# Patient Record
Sex: Female | Born: 1960 | Race: Black or African American | Hispanic: No | Marital: Single | Smoking: Never smoker
Health system: Southern US, Community
[De-identification: ages and names within clinical notes are randomized; demographics above are authoritative.]

## PROBLEM LIST (undated history)

## (undated) DIAGNOSIS — I10 Essential (primary) hypertension: Secondary | ICD-10-CM

## (undated) DIAGNOSIS — M519 Unspecified thoracic, thoracolumbar and lumbosacral intervertebral disc disorder: Secondary | ICD-10-CM

---

## 1999-04-30 ENCOUNTER — Encounter: Payer: Self-pay | Admitting: Emergency Medicine

## 1999-04-30 ENCOUNTER — Emergency Department (HOSPITAL_COMMUNITY): Admission: EM | Admit: 1999-04-30 | Discharge: 1999-04-30 | Payer: Self-pay | Admitting: Emergency Medicine

## 2004-03-27 ENCOUNTER — Emergency Department (HOSPITAL_COMMUNITY): Admission: EM | Admit: 2004-03-27 | Discharge: 2004-03-27 | Payer: Self-pay | Admitting: Emergency Medicine

## 2004-03-31 ENCOUNTER — Other Ambulatory Visit: Admission: RE | Admit: 2004-03-31 | Discharge: 2004-03-31 | Payer: Self-pay | Admitting: Obstetrics and Gynecology

## 2004-04-03 ENCOUNTER — Observation Stay (HOSPITAL_COMMUNITY): Admission: AD | Admit: 2004-04-03 | Discharge: 2004-04-03 | Payer: Self-pay | Admitting: Obstetrics and Gynecology

## 2004-04-10 ENCOUNTER — Ambulatory Visit (HOSPITAL_COMMUNITY): Admission: RE | Admit: 2004-04-10 | Discharge: 2004-04-10 | Payer: Self-pay | Admitting: Obstetrics and Gynecology

## 2004-04-11 ENCOUNTER — Emergency Department (HOSPITAL_COMMUNITY): Admission: EM | Admit: 2004-04-11 | Discharge: 2004-04-11 | Payer: Self-pay | Admitting: Emergency Medicine

## 2005-03-17 ENCOUNTER — Emergency Department (HOSPITAL_COMMUNITY): Admission: EM | Admit: 2005-03-17 | Discharge: 2005-03-17 | Payer: Self-pay | Admitting: Family Medicine

## 2005-03-30 ENCOUNTER — Emergency Department (HOSPITAL_COMMUNITY): Admission: EM | Admit: 2005-03-30 | Discharge: 2005-03-30 | Payer: Self-pay | Admitting: Family Medicine

## 2005-12-09 ENCOUNTER — Inpatient Hospital Stay (HOSPITAL_COMMUNITY): Admission: AD | Admit: 2005-12-09 | Discharge: 2005-12-09 | Payer: Self-pay | Admitting: Gynecology

## 2006-06-10 ENCOUNTER — Emergency Department (HOSPITAL_COMMUNITY): Admission: EM | Admit: 2006-06-10 | Discharge: 2006-06-10 | Payer: Self-pay | Admitting: Emergency Medicine

## 2006-11-08 ENCOUNTER — Encounter: Admission: RE | Admit: 2006-11-08 | Discharge: 2006-11-08 | Payer: Self-pay | Admitting: Occupational Medicine

## 2006-12-09 ENCOUNTER — Encounter: Admission: RE | Admit: 2006-12-09 | Discharge: 2006-12-18 | Payer: Self-pay | Admitting: Occupational Medicine

## 2007-01-27 ENCOUNTER — Emergency Department (HOSPITAL_COMMUNITY): Admission: EM | Admit: 2007-01-27 | Discharge: 2007-01-27 | Payer: Self-pay | Admitting: Emergency Medicine

## 2007-02-18 ENCOUNTER — Ambulatory Visit (HOSPITAL_COMMUNITY): Admission: RE | Admit: 2007-02-18 | Discharge: 2007-02-18 | Payer: Self-pay | Admitting: Cardiology

## 2008-07-24 ENCOUNTER — Emergency Department (HOSPITAL_COMMUNITY): Admission: EM | Admit: 2008-07-24 | Discharge: 2008-07-24 | Payer: Self-pay | Admitting: Emergency Medicine

## 2009-06-28 ENCOUNTER — Encounter: Admission: RE | Admit: 2009-06-28 | Discharge: 2009-06-28 | Payer: Self-pay | Admitting: Neurology

## 2009-07-14 ENCOUNTER — Encounter: Admission: RE | Admit: 2009-07-14 | Discharge: 2009-07-14 | Payer: Self-pay | Admitting: Neurology

## 2010-01-26 ENCOUNTER — Encounter: Payer: Self-pay | Admitting: Obstetrics and Gynecology

## 2010-09-25 LAB — I-STAT 8, (EC8 V) (CONVERTED LAB)
BUN: 10
Chloride: 108
Glucose, Bld: 86
Hemoglobin: 13.6
Potassium: 3.7
Sodium: 138
TCO2: 26
pCO2, Ven: 39.2 — ABNORMAL LOW

## 2010-09-25 LAB — CBC
MCHC: 32.4
RBC: 4.46
WBC: 6.6

## 2010-09-25 LAB — DIFFERENTIAL
Basophils Relative: 0
Lymphocytes Relative: 32
Monocytes Absolute: 0.5
Neutrophils Relative %: 59

## 2010-09-25 LAB — POCT CARDIAC MARKERS: Operator id: 198171

## 2010-09-25 LAB — D-DIMER, QUANTITATIVE: D-Dimer, Quant: <0.22

## 2010-09-25 LAB — POCT I-STAT CREATININE: Creatinine, Ser: 0.8

## 2010-09-26 LAB — COMPREHENSIVE METABOLIC PANEL
AST: 28
Albumin: 4.2
Calcium: 9.3
Chloride: 106
GFR calc Af Amer: >60
GFR calc non Af Amer: >60
Sodium: 139

## 2010-09-26 LAB — URINALYSIS, MICROSCOPIC ONLY
Glucose, UA: NEGATIVE
Ketones, ur: NEGATIVE
Protein, ur: NEGATIVE
Specific Gravity, Urine: 1.019

## 2010-09-26 LAB — CBC
HCT: 36.4
MCHC: 33
MCV: 82.6
Platelets: 309
RBC: 4.41
RDW: 17.9 — ABNORMAL HIGH

## 2010-09-26 LAB — ANA: Anti Nuclear Antibody(ANA): NEGATIVE

## 2012-09-08 ENCOUNTER — Emergency Department (HOSPITAL_BASED_OUTPATIENT_CLINIC_OR_DEPARTMENT_OTHER)
Admission: EM | Admit: 2012-09-08 | Discharge: 2012-09-08 | Disposition: A | Discharge status: Home / Self Care | Payer: Worker's Compensation | Attending: Emergency Medicine | Admitting: Emergency Medicine

## 2012-09-08 ENCOUNTER — Encounter (HOSPITAL_BASED_OUTPATIENT_CLINIC_OR_DEPARTMENT_OTHER): Payer: Self-pay | Admitting: *Deleted

## 2012-09-08 DIAGNOSIS — Z79899 Other long term (current) drug therapy: Secondary | ICD-10-CM | POA: Insufficient documentation

## 2012-09-08 DIAGNOSIS — Z8739 Personal history of other diseases of the musculoskeletal system and connective tissue: Secondary | ICD-10-CM | POA: Insufficient documentation

## 2012-09-08 DIAGNOSIS — G8929 Other chronic pain: Secondary | ICD-10-CM | POA: Insufficient documentation

## 2012-09-08 DIAGNOSIS — Y929 Unspecified place or not applicable: Secondary | ICD-10-CM | POA: Insufficient documentation

## 2012-09-08 DIAGNOSIS — Y9301 Activity, walking, marching and hiking: Secondary | ICD-10-CM | POA: Insufficient documentation

## 2012-09-08 DIAGNOSIS — Y990 Civilian activity done for income or pay: Secondary | ICD-10-CM | POA: Insufficient documentation

## 2012-09-08 DIAGNOSIS — Z791 Long term (current) use of non-steroidal anti-inflammatories (NSAID): Secondary | ICD-10-CM | POA: Insufficient documentation

## 2012-09-08 DIAGNOSIS — I10 Essential (primary) hypertension: Secondary | ICD-10-CM | POA: Insufficient documentation

## 2012-09-08 DIAGNOSIS — M549 Dorsalgia, unspecified: Secondary | ICD-10-CM

## 2012-09-08 DIAGNOSIS — X500XXA Overexertion from strenuous movement or load, initial encounter: Secondary | ICD-10-CM | POA: Insufficient documentation

## 2012-09-08 DIAGNOSIS — IMO0002 Reserved for concepts with insufficient information to code with codable children: Secondary | ICD-10-CM | POA: Insufficient documentation

## 2012-09-08 HISTORY — DX: Unspecified thoracic, thoracolumbar and lumbosacral intervertebral disc disorder: M51.9

## 2012-09-08 HISTORY — DX: Essential (primary) hypertension: I10

## 2012-09-08 MED ORDER — ONDANSETRON HCL 4 MG/2ML IJ SOLN
4.0000 mg | Freq: Once | INTRAMUSCULAR | Status: AC
  Administered 2012-09-08: 4 mg via INTRAMUSCULAR
  Filled 2012-09-08: qty 2

## 2012-09-08 MED ORDER — HYDROCODONE-ACETAMINOPHEN 5-325 MG PO TABS: 2.0000 | ORAL_TABLET | ORAL | Status: DC | PRN

## 2012-09-08 MED ORDER — DEXAMETHASONE SODIUM PHOSPHATE 10 MG/ML IJ SOLN
10.0000 mg | Freq: Once | INTRAMUSCULAR | Status: AC
  Administered 2012-09-08: 10 mg via INTRAMUSCULAR
  Filled 2012-09-08: qty 1

## 2012-09-08 MED ORDER — HYDROMORPHONE HCL PF 2 MG/ML IJ SOLN
2.0000 mg | Freq: Once | INTRAMUSCULAR | Status: AC
  Administered 2012-09-08: 2 mg via INTRAMUSCULAR
  Filled 2012-09-08: qty 1

## 2012-09-08 NOTE — ED Notes (Signed)
Pt to room 2 by ptar, reporting she was walking through a door at work and another employee was coming the opposite direction, she braced herself and "my back just locked up". Pt states she has a hx of lbp and disc problems.

## 2012-09-08 NOTE — ED Provider Notes (Signed)
CSN: [id number]     Arrival date & time 09/08/12  1849 History   First MD Initiated Contact with Patient 09/08/12 1903     Chief Complaint  Patient presents with  . Back Pain   (Consider location/radiation/quality/duration/timing/severity/associated sxs/prior Treatment) HPI Comments: Patient with a history of chronic back pain secondary to "disc problems", presents with worsening back pain. She states she was walking through a door at work and was trying to miss another employee and suddenly twisted her back. She states that it locked up and she's having bad muscle spasms on the left side of her back. The pain radiates slightly into her leg. She did not fall. She denies any numbness or weakness to her legs. She denies any perineal anesthesia or bowel or bladder incontinence. She states this is similar pain that she's had before when her back "locks up". She currently does not have a provider to manage her ongoing back problems.  Patient is a 52 y.o. female presenting with back pain.  Back Pain Associated symptoms: no abdominal pain, no chest pain, no fever, no headaches, no numbness and no weakness     Past Medical History  Diagnosis Date  . Hypertension   . Ruptured disk    History reviewed. No pertinent past surgical history. No family history on file. History  Substance Use Topics  . Smoking status: Never Smoker   . Smokeless tobacco: Not on file  . Alcohol Use: Not on file   OB History   Grav Para Term Preterm Abortions TAB SAB Ect Mult Living                 Review of Systems  Constitutional: Negative for fever, chills, diaphoresis and fatigue.  HENT: Negative for congestion, rhinorrhea and sneezing.   Eyes: Negative.   Respiratory: Negative for cough, chest tightness and shortness of breath.   Cardiovascular: Negative for chest pain and leg swelling.  Gastrointestinal: Negative for nausea, vomiting, abdominal pain, diarrhea and blood in stool.  Genitourinary: Negative for  frequency, hematuria, flank pain and difficulty urinating.  Musculoskeletal: Positive for back pain. Negative for arthralgias.  Skin: Negative for rash.  Neurological: Negative for dizziness, speech difficulty, weakness, numbness and headaches.    Allergies  Review of patient's allergies indicates no known allergies.  Home Medications   Current Outpatient Rx  Name  Route  Sig  Dispense  Refill  . cyclobenzaprine (FLEXERIL) 10 MG tablet   Oral   Take 10 mg by mouth 2 (two) times daily as needed for muscle spasms.         Marland Kitchen LISINOPRIL PO   Oral   Take by mouth.         . meloxicam (MOBIC) 15 MG tablet   Oral   Take 15 mg by mouth daily.         Marland Kitchen HYDROcodone-acetaminophen (NORCO/VICODIN) 5-325 MG per tablet   Oral   Take 2 tablets by mouth every 4 (four) hours as needed for pain.   15 tablet   0    BP 145/87  Pulse 87  Temp(Src) 98.2 F (36.8 C) (Oral)  Resp 18  Ht 5\' 4"  (1.626 m)  Wt 140 lb (63.504 kg)  BMI 24.02 kg/m2  SpO2 100% Physical Exam  Constitutional: She is oriented to person, place, and time. She appears well-developed and well-nourished.  HENT:  Head: Normocephalic and atraumatic.  Eyes: Pupils are equal, round, and reactive to light.  Neck: Normal range of motion. Neck supple.  Cardiovascular: Normal rate, regular rhythm and normal heart sounds.   Pulmonary/Chest: Effort normal and breath sounds normal. No respiratory distress. She has no wheezes. She has no rales. She exhibits no tenderness.  Abdominal: Soft. Bowel sounds are normal. There is no tenderness. There is no rebound and no guarding.  Musculoskeletal: Normal range of motion. She exhibits no edema.  Positive tenderness throughout the lower lumbar spine, primarily in the left side. There is new step-offs or deformities lumbar spine. Positive straight leg raise on the left. She has normal sensation and motor function in the legs. Pedal pulses are intact.  Lymphadenopathy:    She has no  cervical adenopathy.  Neurological: She is alert and oriented to person, place, and time.  Skin: Skin is warm and dry. No rash noted.  Psychiatric: She has a normal mood and affect.    ED Course  Procedures (including critical care time) Labs Review Labs Reviewed - No data to display Imaging Review No results found.  MDM   1. Back pain    Patient is feeling much better after the medications administered in the ED. She has no neurologic deficits. She was given a prescription for Percocet to use at home and referred to the [facility] for followup.    Rolan Bucco, MD 09/08/12 2053

## 2012-09-21 NOTE — Progress Notes (Signed)
ED CM received call from [facility] at [facility] at [place]. 336 [phone], querying filling a prescription for Norco 5/325 that was written on 09/08/12.  Spoke with Dr. Fredderick Phenix she approve the prescription to be filled.

## 2013-03-15 ENCOUNTER — Encounter (HOSPITAL_BASED_OUTPATIENT_CLINIC_OR_DEPARTMENT_OTHER): Payer: Self-pay | Admitting: Emergency Medicine

## 2013-03-15 ENCOUNTER — Emergency Department (HOSPITAL_BASED_OUTPATIENT_CLINIC_OR_DEPARTMENT_OTHER): Payer: Medicaid Other

## 2013-03-15 ENCOUNTER — Emergency Department (HOSPITAL_BASED_OUTPATIENT_CLINIC_OR_DEPARTMENT_OTHER)
Admission: EM | Admit: 2013-03-15 | Discharge: 2013-03-15 | Disposition: A | Discharge status: Home / Self Care | Payer: Medicaid Other | Attending: Emergency Medicine | Admitting: Emergency Medicine

## 2013-03-15 DIAGNOSIS — R519 Headache, unspecified: Secondary | ICD-10-CM

## 2013-03-15 DIAGNOSIS — R51 Headache: Secondary | ICD-10-CM | POA: Insufficient documentation

## 2013-03-15 DIAGNOSIS — I10 Essential (primary) hypertension: Secondary | ICD-10-CM | POA: Insufficient documentation

## 2013-03-15 DIAGNOSIS — R209 Unspecified disturbances of skin sensation: Secondary | ICD-10-CM | POA: Insufficient documentation

## 2013-03-15 DIAGNOSIS — R112 Nausea with vomiting, unspecified: Secondary | ICD-10-CM | POA: Insufficient documentation

## 2013-03-15 DIAGNOSIS — Z791 Long term (current) use of non-steroidal anti-inflammatories (NSAID): Secondary | ICD-10-CM | POA: Insufficient documentation

## 2013-03-15 DIAGNOSIS — R42 Dizziness and giddiness: Secondary | ICD-10-CM

## 2013-03-15 DIAGNOSIS — Z8739 Personal history of other diseases of the musculoskeletal system and connective tissue: Secondary | ICD-10-CM | POA: Insufficient documentation

## 2013-03-15 LAB — CBC WITH DIFFERENTIAL/PLATELET
BASOS PCT: 0 % (ref 0–1)
Basophils Absolute: 0 10*3/uL (ref 0.0–0.1)
EOS PCT: 1 % (ref 0–5)
Eosinophils Absolute: 0.1 10*3/uL (ref 0.0–0.7)
HEMATOCRIT: 38.8 % (ref 36.0–46.0)
Hemoglobin: 13 g/dL (ref 12.0–15.0)
LYMPHS PCT: 32 % (ref 12–46)
Lymphs Abs: 2.3 10*3/uL (ref 0.7–4.0)
MCH: 29.3 pg (ref 26.0–34.0)
MCHC: 33.5 g/dL (ref 30.0–36.0)
MCV: 87.6 fL (ref 78.0–100.0)
Monocytes Absolute: 0.6 10*3/uL (ref 0.1–1.0)
Monocytes Relative: 8 % (ref 3–12)
Neutro Abs: 4.2 10*3/uL (ref 1.7–7.7)
Neutrophils Relative %: 58 % (ref 43–77)
PLATELETS: 281 10*3/uL (ref 150–400)
RBC: 4.43 MIL/uL (ref 3.87–5.11)
RDW: 13 % (ref 11.5–15.5)
WBC: 7.2 10*3/uL (ref 4.0–10.5)

## 2013-03-15 LAB — I-STAT CHEM 8, ED
BUN: 12 mg/dL (ref 6–23)
CALCIUM ION: 1 mmol/L — AB (ref 1.12–1.23)
CHLORIDE: 106 meq/L (ref 96–112)
Creatinine, Ser: 0.9 mg/dL (ref 0.50–1.10)
GLUCOSE: 91 mg/dL (ref 70–99)
HEMATOCRIT: 40 % (ref 36.0–46.0)
Hemoglobin: 13.6 g/dL (ref 12.0–15.0)
Potassium: 3.3 mEq/L — ABNORMAL LOW (ref 3.7–5.3)
Sodium: 139 mEq/L (ref 137–147)
TCO2: 18 mmol/L (ref 0–100)

## 2013-03-15 LAB — TROPONIN I

## 2013-03-15 MED ORDER — METOCLOPRAMIDE HCL 5 MG/ML IJ SOLN
10.0000 mg | Freq: Once | INTRAMUSCULAR | Status: AC
  Administered 2013-03-15: 10 mg via INTRAVENOUS
  Filled 2013-03-15: qty 2

## 2013-03-15 MED ORDER — MECLIZINE HCL 25 MG PO TABS
25.0000 mg | ORAL_TABLET | Freq: Once | ORAL | Status: AC
  Administered 2013-03-15: 25 mg via ORAL
  Filled 2013-03-15: qty 1

## 2013-03-15 MED ORDER — KETOROLAC TROMETHAMINE 30 MG/ML IJ SOLN
30.0000 mg | Freq: Once | INTRAMUSCULAR | Status: AC
  Administered 2013-03-15: 30 mg via INTRAVENOUS
  Filled 2013-03-15: qty 2

## 2013-03-15 NOTE — ED Provider Notes (Signed)
CSN: [id number]     Arrival date & time 03/15/13  1208 History   First MD Initiated Contact with Patient 03/15/13 1214     Chief Complaint  Patient presents with  . Headache  . Tingling     (Consider location/radiation/quality/duration/timing/severity/associated sxs/prior Treatment) HPI Comments: Pt states that she developed a generalized headache yesterday and then developed nausea  vomitting and dizziness this morning.she has had tingling to her entire body. She has not taken anything for her symptoms. Pt states that she hasn't had her bp medications in the last 2 days. Denies cp but has had some sob, but that is nothing new. Pt denies vision changes, but states that the dizziness goes away with closing her eyes. No fever, abdominal pain  The history is provided by the patient. No language interpreter was used.    Past Medical History  Diagnosis Date  . Hypertension   . Ruptured disk    History reviewed. No pertinent past surgical history. History reviewed. No pertinent family history. History  Substance Use Topics  . Smoking status: Never Smoker   . Smokeless tobacco: Not on file  . Alcohol Use: Not on file   OB History   Grav Para Term Preterm Abortions TAB SAB Ect Mult Living                 Review of Systems  Constitutional: Negative.   Eyes: Negative for photophobia.  Cardiovascular: Negative.       Allergies  Review of patient's allergies indicates no known allergies.  Home Medications   Current Outpatient Rx  Name  Route  Sig  Dispense  Refill  . LISINOPRIL PO   Oral   Take by mouth.         . cyclobenzaprine (FLEXERIL) 10 MG tablet   Oral   Take 10 mg by mouth 2 (two) times daily as needed for muscle spasms.         Marland Kitchen. HYDROcodone-acetaminophen (NORCO/VICODIN) 5-325 MG per tablet   Oral   Take 2 tablets by mouth every 4 (four) hours as needed for pain.   15 tablet   0   . meloxicam (MOBIC) 15 MG tablet   Oral   Take 15 mg by mouth daily.          There were no vitals taken for this visit. Physical Exam  Nursing note and vitals reviewed. Constitutional: She is oriented to person, place, and time. She appears well-developed and well-nourished.  HENT:  Head: Normocephalic and atraumatic.  Eyes: Conjunctivae and EOM are normal. Pupils are equal, round, and reactive to light.  Cardiovascular: Normal rate and regular rhythm.   Pulmonary/Chest: Effort normal and breath sounds normal.  Musculoskeletal: Normal range of motion.  Neurological: She is alert and oriented to person, place, and time. Coordination normal.  Skin: Skin is warm and dry.  Psychiatric: She has a normal mood and affect.    ED Course  Procedures (including critical care time) Labs Review Labs Reviewed  I-STAT CHEM 8, ED - Abnormal; Notable for the following:    Potassium 3.3 (*)    Calcium, Ion 1.00 (*)    All other components within normal limits  CBC WITH DIFFERENTIAL  TROPONIN I   Imaging Review Dg Chest 2 View  03/15/2013   EXAM CHEST  2 VIEW  COMPARISON None.  FINDINGS The heart size and mediastinal contours are within normal limits. Both lungs are clear. The visualized skeletal structures are unremarkable.  IMPRESSION No active  cardiopulmonary disease.  SIGNATURE  Electronically Signed   By: Esperanza Heir M.D.   On: 03/15/2013 13:18   Ct Head Wo Contrast  03/15/2013   CLINICAL DATA Headache nausea and vomiting  EXAM CT HEAD WITHOUT CONTRAST  TECHNIQUE Contiguous axial images were obtained from the base of the skull through the vertex without intravenous contrast.  COMPARISON CT HEAD W/O CM dated 06/10/2006  FINDINGS No mass lesion. No midline shift. No acute hemorrhage or hematoma. No extra-axial fluid collections. No evidence of acute infarction.  IMPRESSION Normal head CT  SIGNATURE  Electronically Signed   By: Esperanza Heir M.D.   On: 03/15/2013 13:16     EKG Interpretation   Date/Time:  Wednesday March 15 2013 12:19:07 EDT Ventricular  Rate:  85 PR Interval:  116 QRS Duration: 72 QT Interval:  370 QTC Calculation: 440 R Axis:   65 Text Interpretation:  Normal sinus rhythm Possible Left atrial enlargement  Borderline ECG No significant change since last tracing Confirmed by WARD,   DO, KRISTEN ([id number]) on 03/15/2013 12:23:43 PM      MDM   Final diagnoses:  HTN (hypertension)  Headache  Dizziness    pts symptoms have completely resolved at this time. Symptoms likely related to elevated bp.pt is to go to [facility]cp at [facility] tomorrow so she is refusing having the lisinopril refilled here today    Teressa Lower, NP 03/15/13 1545

## 2013-03-15 NOTE — ED Notes (Signed)
Pt states she has had headache since yesterday.  This am she felt like her body felt strange, had some N/V, with some tingling in her hands.  Denies chest pain.  Sob at times with exertion.

## 2013-03-15 NOTE — Discharge Instructions (Signed)
Follow up at the clinic tomorrow as planned to get the new prescription for your blood pressure medication Benign Positional Vertigo Vertigo means you feel like you or your surroundings are moving when they are not. Benign positional vertigo is the most common form of vertigo. Benign means that the cause of your condition is not serious. Benign positional vertigo is more common in older adults. CAUSES  Benign positional vertigo is the result of an upset in the labyrinth system. This is an area in the middle ear that helps control your balance. This may be caused by a viral infection, head injury, or repetitive motion. However, often no specific cause is found. SYMPTOMS  Symptoms of benign positional vertigo occur when you move your head or eyes in different directions. Some of the symptoms may include:  Loss of balance and falls.  Vomiting.  Blurred vision.  Dizziness.  Nausea.  Involuntary eye movements (nystagmus). DIAGNOSIS  Benign positional vertigo is usually diagnosed by physical exam. If the specific cause of your benign positional vertigo is unknown, your caregiver may perform imaging tests, such as magnetic resonance imaging (MRI) or computed tomography (CT). TREATMENT  Your caregiver may recommend movements or procedures to correct the benign positional vertigo. Medicines such as meclizine, benzodiazepines, and medicines for nausea may be used to treat your symptoms. In rare cases, if your symptoms are caused by certain conditions that affect the inner ear, you may need surgery. HOME CARE INSTRUCTIONS   Follow your caregiver's instructions.  Move slowly. Do not make sudden body or head movements.  Avoid driving.  Avoid operating heavy machinery.  Avoid performing any tasks that would be dangerous to you or others during a vertigo episode.  Drink enough fluids to keep your urine clear or pale yellow. SEEK IMMEDIATE MEDICAL CARE IF:   You develop problems with walking,  weakness, numbness, or using your arms, hands, or legs.  You have difficulty speaking.  You develop severe headaches.  Your nausea or vomiting continues or gets worse.  You develop visual changes.  Your family or friends notice any behavioral changes.  Your condition gets worse.  You have a fever.  You develop a stiff neck or sensitivity to light. MAKE SURE YOU:   Understand these instructions.  Will watch your condition.  Will get help right away if you are not doing well or get worse. Document Released: 09/29/2005 Document Revised: 03/16/2011 Document Reviewed: 09/11/2010 [facility] Patient Information 2014 [place], [place].

## 2013-03-16 NOTE — ED Provider Notes (Signed)
Medical screening examination/treatment/procedure(s) were performed by non-physician practitioner and as supervising physician I was immediately available for consultation/collaboration.   EKG Interpretation   Date/Time:  Wednesday March 15 2013 12:19:07 EDT Ventricular Rate:  85 PR Interval:  116 QRS Duration: 72 QT Interval:  370 QTC Calculation: 440 R Axis:   65 Text Interpretation:  Normal sinus rhythm Possible Left atrial enlargement  Borderline ECG No significant change since last tracing Confirmed by Tyesha Joffe,   DO, Kailan Laws [phone]([id number] on 03/15/2013 12:23:43 PM        Layla MawKristen N Yul Diana, DO 03/16/13 [id number]

## 2013-03-31 ENCOUNTER — Emergency Department (HOSPITAL_COMMUNITY)
Admission: EM | Admit: 2013-03-31 | Discharge: 2013-03-31 | Disposition: A | Discharge status: Home / Self Care | Payer: Medicaid Other | Source: Home / Self Care | Attending: Family Medicine | Admitting: Family Medicine

## 2013-03-31 ENCOUNTER — Encounter (HOSPITAL_COMMUNITY): Payer: Self-pay | Admitting: Emergency Medicine

## 2013-03-31 DIAGNOSIS — M5430 Sciatica, unspecified side: Secondary | ICD-10-CM

## 2013-03-31 MED ORDER — PREDNISONE 10 MG PO KIT: PACK | ORAL | Status: DC

## 2013-03-31 MED ORDER — TRAMADOL HCL 50 MG PO TABS: 50.0000 mg | ORAL_TABLET | Freq: Four times a day (QID) | ORAL | Status: DC | PRN

## 2013-03-31 NOTE — ED Provider Notes (Signed)
Sabrina Nguyen is a 53 y.o. female who presents to Urgent Care today for right sided back pain radiating to the right toe. This is been present for a few days. No weakness or numbness. No bowel bladder dysfunction. Pain with ambulation. Patient has been using meloxicam which has not helped. She has a old MRI reportedly that shows a ruptured disc. No fevers chills nausea vomiting or diarrhea   Past Medical History  Diagnosis Date  . Hypertension   . Ruptured disk    History  Substance Use Topics  . Smoking status: Never Smoker   . Smokeless tobacco: Not on file  . Alcohol Use: Yes   ROS as above Medications: No current facility-administered medications for this encounter.   Current Outpatient Prescriptions  Medication Sig Dispense Refill  . LISINOPRIL PO Take by mouth.      . cyclobenzaprine (FLEXERIL) 10 MG tablet Take 10 mg by mouth 2 (two) times daily as needed for muscle spasms.      . PredniSONE 10 MG KIT 12 day dose pack po  1 kit  0  . traMADol (ULTRAM) 50 MG tablet Take 1 tablet (50 mg total) by mouth every 6 (six) hours as needed.  15 tablet  0    Exam:  BP 170/92  Pulse 71  Temp(Src) 98.6 F (37 C) (Oral)  Resp 18  SpO2 100% Gen: Well NAD Back: Nontender to spinal midline. Tender palpation right SI joint. Positive straight leg raise test on the right. Negative Faber test bilaterally and left straight leg raise test. Strength and reflexes and sensation are intact bilateral extremities. Low back range of motion is decreased to flexion and extension secondary to pain.  Assessment and Plan: 53 y.o. female with sciatica. Likely due to ruptured disc. Plan to treat with prednisone Dosepak and tramadol. Refer to sports medicine/orthopedics for further evaluation and management. If he does that does not work, patient may benefit from epidural steroid injection  Discussed warning signs or symptoms. Please see discharge instructions. Patient expresses  understanding.    Gregor Hams, MD 03/31/13 1524

## 2013-03-31 NOTE — ED Notes (Signed)
See Physicians note Pt c/o lower back and hip pain onset Wednesday Pain is constant and increases w/acitivity Hx of ruptured disc?? Slow gait Alert w/no signs of acute distress.

## 2013-03-31 NOTE — Discharge Instructions (Signed)
Thank you for coming in today. Take prednisone as directed for 12 days. Use tramadol for severe pain. [facility] Come back or go to the emergency room if you notice new weakness new numbness problems walking or bowel or bladder problems. Sciatica Sciatica is pain, weakness, numbness, or tingling along the path of the sciatic nerve. The nerve starts in the lower back and runs down the back of each leg. The nerve controls the muscles in the lower leg and in the back of the knee, while also providing sensation to the back of the thigh, lower leg, and the sole of your foot. Sciatica is a symptom of another medical condition. For instance, nerve damage or certain conditions, such as a herniated disk or bone spur on the spine, pinch or put pressure on the sciatic nerve. This causes the pain, weakness, or other sensations normally associated with sciatica. Generally, sciatica only affects one side of the body. CAUSES   Herniated or slipped disc.  Degenerative disk disease.  A pain disorder involving the narrow muscle in the buttocks (piriformis syndrome).  Pelvic injury or fracture.  Pregnancy.  Tumor (rare). SYMPTOMS  Symptoms can vary from mild to very severe. The symptoms usually travel from the low back to the buttocks and down the back of the leg. Symptoms can include:  Mild tingling or dull aches in the lower back, leg, or hip.  Numbness in the back of the calf or sole of the foot.  Burning sensations in the lower back, leg, or hip.  Sharp pains in the lower back, leg, or hip.  Leg weakness.  Severe back pain inhibiting movement. These symptoms may get worse with coughing, sneezing, laughing, or prolonged sitting or standing. Also, being overweight may worsen symptoms. DIAGNOSIS  Your caregiver will perform a physical exam to look for common symptoms of sciatica. He or she may ask you to do certain movements or activities that would trigger  sciatic nerve pain. Other tests may be performed to find the cause of the sciatica. These may include:  Blood tests.  X-rays.  Imaging tests, such as an MRI or CT scan. TREATMENT  Treatment is directed at the cause of the sciatic pain. Sometimes, treatment is not necessary and the pain and discomfort goes away on its own. If treatment is needed, your caregiver may suggest:  Over-the-counter medicines to relieve pain.  Prescription medicines, such as anti-inflammatory medicine, muscle relaxants, or narcotics.  Applying heat or ice to the painful area.  Steroid injections to lessen pain, irritation, and inflammation around the nerve.  Reducing activity during periods of pain.  Exercising and stretching to strengthen your abdomen and improve flexibility of your spine. Your caregiver may suggest losing weight if the extra weight makes the back pain worse.  Physical therapy.  Surgery to eliminate what is pressing or pinching the nerve, such as a bone spur or part of a herniated disk. HOME CARE INSTRUCTIONS   Only take over-the-counter or prescription medicines for pain or discomfort as directed by your caregiver.  Apply ice to the affected area for 20 minutes, 3 4 times a day for the first 48 72 hours. Then try heat in the same way.  Exercise, stretch, or perform your usual activities if these do not aggravate your pain.  Attend physical therapy sessions as directed by your caregiver.  Keep all follow-up appointments as directed by your caregiver.  Do not wear high heels or shoes that do not  provide proper support.  Check your mattress to see if it is too soft. A firm mattress may lessen your pain and discomfort. SEEK IMMEDIATE MEDICAL CARE IF:   You lose control of your bowel or bladder (incontinence).  You have increasing weakness in the lower back, pelvis, buttocks, or legs.  You have redness or swelling of your back.  You have a burning sensation when you  urinate.  You have pain that gets worse when you lie down or awakens you at night.  Your pain is worse than you have experienced in the past.  Your pain is lasting longer than 4 weeks.  You are suddenly losing weight without reason. MAKE SURE YOU:  Understand these instructions.  Will watch your condition.  Will get help right away if you are not doing well or get worse. Document Released: 12/16/2000 Document Revised: 06/23/2011 Document Reviewed: 05/03/2011 [facility] Patient Information 2014 [place], [place].

## 2013-06-19 ENCOUNTER — Emergency Department (INDEPENDENT_AMBULATORY_CARE_PROVIDER_SITE_OTHER)
Admission: EM | Admit: 2013-06-19 | Discharge: 2013-06-19 | Disposition: A | Discharge status: Home / Self Care | Payer: Medicaid Other | Source: Home / Self Care | Attending: Emergency Medicine | Admitting: Emergency Medicine

## 2013-06-19 ENCOUNTER — Encounter (HOSPITAL_COMMUNITY): Payer: Self-pay | Admitting: Emergency Medicine

## 2013-06-19 ENCOUNTER — Emergency Department (INDEPENDENT_AMBULATORY_CARE_PROVIDER_SITE_OTHER): Payer: Medicaid Other

## 2013-06-19 DIAGNOSIS — Y92009 Unspecified place in unspecified non-institutional (private) residence as the place of occurrence of the external cause: Secondary | ICD-10-CM

## 2013-06-19 DIAGNOSIS — W19XXXA Unspecified fall, initial encounter: Secondary | ICD-10-CM

## 2013-06-19 DIAGNOSIS — S8000XA Contusion of unspecified knee, initial encounter: Secondary | ICD-10-CM

## 2013-06-19 DIAGNOSIS — IMO0002 Reserved for concepts with insufficient information to code with codable children: Secondary | ICD-10-CM

## 2013-06-19 DIAGNOSIS — W010XXA Fall on same level from slipping, tripping and stumbling without subsequent striking against object, initial encounter: Secondary | ICD-10-CM

## 2013-06-19 DIAGNOSIS — S80219A Abrasion, unspecified knee, initial encounter: Secondary | ICD-10-CM

## 2013-06-19 MED ORDER — LIDOCAINE-EPINEPHRINE-TETRACAINE (LET) SOLUTION
NASAL | Status: AC
  Filled 2013-06-19: qty 3

## 2013-06-19 MED ORDER — HYDROCODONE-ACETAMINOPHEN 5-325 MG PO TABS: 1.0000 | ORAL_TABLET | Freq: Four times a day (QID) | ORAL | Status: DC | PRN

## 2013-06-19 MED ORDER — IBUPROFEN 800 MG PO TABS: 800.0000 mg | ORAL_TABLET | Freq: Three times a day (TID) | ORAL | Status: DC

## 2013-06-19 MED ORDER — LIDOCAINE-EPINEPHRINE-TETRACAINE (LET) SOLUTION
3.0000 mL | Freq: Once | NASAL | Status: AC
  Administered 2013-06-19: 3 mL via TOPICAL

## 2013-06-19 MED ORDER — SILVER SULFADIAZINE 1 % EX CREA: 1.0000 "application " | TOPICAL_CREAM | Freq: Every day | CUTANEOUS | Status: DC

## 2013-06-19 NOTE — Discharge Instructions (Signed)
Abrasion °An abrasion is a cut or scrape of the skin. Abrasions do not extend through all layers of the skin and most heal within 10 days. It is important to care for your abrasion properly to prevent infection. °CAUSES  °Most abrasions are caused by falling on, or gliding across, the ground or other surface. When your skin rubs on something, the outer and inner layer of skin rubs off, causing an abrasion. °DIAGNOSIS  °Your caregiver will be able to diagnose an abrasion during a physical exam.  °TREATMENT  °Your treatment depends on how large and deep the abrasion is. Generally, your abrasion will be cleaned with water and a mild soap to remove any dirt or debris. An antibiotic ointment may be put over the abrasion to prevent an infection. A bandage (dressing) may be wrapped around the abrasion to keep it from getting dirty.  °You may need a tetanus shot if: °· You cannot remember when you had your last tetanus shot. °· You have never had a tetanus shot. °· The injury broke your skin. °If you get a tetanus shot, your arm may swell, get red, and feel warm to the touch. This is common and not a problem. If you need a tetanus shot and you choose not to have one, there is a rare chance of getting tetanus. Sickness from tetanus can be serious.  °HOME CARE INSTRUCTIONS  °· If a dressing was applied, change it at least once a day or as directed by your caregiver. If the bandage sticks, soak it off with warm water.   °· Wash the area with water and a mild soap to remove all the ointment 2 times a day. Rinse off the soap and pat the area dry with a clean towel.   °· Reapply any ointment as directed by your caregiver. This will help prevent infection and keep the bandage from sticking. Use gauze over the wound and under the dressing to help keep the bandage from sticking.   °· Change your dressing right away if it becomes wet or dirty.   °· Only take over-the-counter or prescription medicines for pain, discomfort, or fever as  directed by your caregiver.   °· Follow up with your caregiver within 24 48 hours for a wound check, or as directed. If you were not given a wound-check appointment, look closely at your abrasion for redness, swelling, or pus. These are signs of infection. °SEEK IMMEDIATE MEDICAL CARE IF:  °· You have increasing pain in the wound.   °· You have redness, swelling, or tenderness around the wound.   °· You have pus coming from the wound.   °· You have a fever or persistent symptoms for more than 2 3 days. °· You have a fever and your symptoms suddenly get worse. °· You have a bad smell coming from the wound or dressing.   °MAKE SURE YOU:  °· Understand these instructions. °· Will watch your condition. °· Will get help right away if you are not doing well or get worse. °Document Released: 10/01/2004 Document Revised: 12/09/2011 Document Reviewed: 11/25/2010 °ExitCare® Patient Information ©2014 [employer]. ° °Contusion °A contusion is a deep bruise. Contusions are the result of an injury that caused bleeding under the skin. The contusion may turn blue, purple, or yellow. Minor injuries will give you a painless contusion, but more severe contusions may stay painful and swollen for a few weeks.  °CAUSES  °A contusion is usually caused by a blow, trauma, or direct force to an area of the body. °SYMPTOMS  °·   Swelling and redness of the injured area. °· Bruising of the injured area. °· Tenderness and soreness of the injured area. °· Pain. °DIAGNOSIS  °The diagnosis can be made by taking a history and physical exam. An X-ray, CT scan, or MRI may be needed to determine if there were any associated injuries, such as fractures. °TREATMENT  °Specific treatment will depend on what area of the body was injured. In general, the best treatment for a contusion is resting, icing, elevating, and applying cold compresses to the injured area. Over-the-counter medicines may also be recommended for pain control. Ask your caregiver what  the best treatment is for your contusion. °HOME CARE INSTRUCTIONS  °· Put ice on the injured area. °· Put ice in a plastic bag. °· Place a towel between your skin and the bag. °· Leave the ice on for 15-20 minutes, 03-04 times a day. °· Only take over-the-counter or prescription medicines for pain, discomfort, or fever as directed by your caregiver. Your caregiver may recommend avoiding anti-inflammatory medicines (aspirin, ibuprofen, and naproxen) for 48 hours because these medicines may increase bruising. °· Rest the injured area. °· If possible, elevate the injured area to reduce swelling. °SEEK IMMEDIATE MEDICAL CARE IF:  °· You have increased bruising or swelling. °· You have pain that is getting worse. °· Your swelling or pain is not relieved with medicines. °MAKE SURE YOU:  °· Understand these instructions. °· Will watch your condition. °· Will get help right away if you are not doing well or get worse. °Document Released: 10/01/2004 Document Revised: 03/16/2011 Document Reviewed: 10/27/2010 °ExitCare® Patient Information ©2014 [employer]. ° °

## 2013-06-19 NOTE — ED Notes (Signed)
Patient c/o fall this morning outside. She slipped on wet grass while taking out the garbage. Patient has an abrasion on the left knee and has swelling. Patient is alert and oriented and in no acute distress.

## 2013-06-19 NOTE — ED Provider Notes (Signed)
CSN: [id number]     Arrival date & time 06/19/13  0818 History   None    Chief Complaint  Patient presents with  . Fall   (Consider location/radiation/quality/duration/timing/severity/associated sxs/prior Treatment) HPI Comments: 53 year old female presents for evaluation of left knee pain after a fall. She was taking out the garbage when she slipped and fell directly down onto her left knee. She had immediate pain and swelling and has not been able to bear weight on the knee. She also has a large abrasion on the front of the knee. She denies any previous history of injuries to that knee. She denies any numbness or weakness in the leg. No systemic symptoms. No other injury  Patient is a 53 y.o. female presenting with fall.  Fall    Past Medical History  Diagnosis Date  . Hypertension   . Ruptured disk    History reviewed. No pertinent past surgical history. No family history on file. History  Substance Use Topics  . Smoking status: Never Smoker   . Smokeless tobacco: Not on file  . Alcohol Use: Yes   OB History   Grav Para Term Preterm Abortions TAB SAB Ect Mult Living                 Review of Systems  Musculoskeletal: Positive for arthralgias and joint swelling.  Skin: Positive for wound.  All other systems reviewed and are negative.   Allergies  Latex  Home Medications   Prior to Admission medications   Medication Sig Start Date End Date Taking? Authorizing Provider  amLODipine (NORVASC) 10 MG tablet Take 10 mg by mouth daily.    Historical Provider, MD  cyclobenzaprine (FLEXERIL) 10 MG tablet Take 10 mg by mouth 2 (two) times daily as needed for muscle spasms.    Historical Provider, MD  HYDROcodone-acetaminophen (NORCO) 5-325 MG per tablet Take 1 tablet by mouth every 6 (six) hours as needed for moderate pain. 06/19/13   Liam Graham, PA-C  ibuprofen (ADVIL,MOTRIN) 800 MG tablet Take 1 tablet (800 mg total) by mouth 3 (three) times daily. 06/19/13   Freeman Caldron  Oleta Gunnoe, PA-C  LISINOPRIL PO Take 20-25 mg by mouth.     Historical Provider, MD  PredniSONE 10 MG KIT 12 day dose pack po 03/31/13   Gregor Hams, MD  silver sulfADIAZINE (SILVADENE) 1 % cream Apply 1 application topically daily. 06/19/13   Liam Graham, PA-C  traMADol (ULTRAM) 50 MG tablet Take 1 tablet (50 mg total) by mouth every 6 (six) hours as needed. 03/31/13   Gregor Hams, MD   BP 120/73  Pulse 78  Temp(Src) 98.6 F (37 C) (Oral)  Resp 14  SpO2 100% Physical Exam  Nursing note and vitals reviewed. Constitutional: She is oriented to person, place, and time. Vital signs are normal. She appears well-developed and well-nourished. No distress.  HENT:  Head: Normocephalic and atraumatic.  Pulmonary/Chest: Effort normal. No respiratory distress.  Musculoskeletal:       Left knee: She exhibits swelling (there is an abrasion with swelling over the left patellar tendon, with tenderness to palpation over the area of swelling and along the medial and lateral joint lines). She exhibits no effusion, no ecchymosis, no LCL laxity, no bony tenderness and no MCL laxity. Tenderness found. Medial joint line, lateral joint line and patellar tendon tenderness noted.  Neurological: She is alert and oriented to person, place, and time. She has normal strength. Coordination normal.  Skin: Skin is warm  and dry. No rash noted. She is not diaphoretic.  Psychiatric: She has a normal mood and affect. Judgment normal.    ED Course  Procedures (including critical care time) Labs Review Labs Reviewed - No data to display  Imaging Review Dg Knee 4 Views W/patella Left  06/19/2013   CLINICAL DATA:  Pain post trauma  EXAM: LEFT KNEE - COMPLETE 4+ VIEW  COMPARISON:  None.  FINDINGS: Frontal, lateral, bilateral oblique, and sunrise patellar images were obtained. No fracture, dislocation, or effusion. Joint spaces appear intact. No erosive change.  IMPRESSION: No abnormality noted.   Electronically Signed   By:  Lowella Grip M.D.   On: 06/19/2013 09:12     MDM   1. Fall   2. Abrasion, knee   3. Contusion, knee    No radiographic evidence of fracture. The wound was allowed to sit with topical LET on it for a period of 20 minutes, then the abrasion was scrubbed and debrided. The wound was dressed with Silvadene cream and a nonadherent dressing, and a light pressure bandage. Treat pain with ibuprofen and a few tablets of hydrocodone. Followup with orthopedics if no improvement in a few days. Crutches provided for the patient as well.   Meds ordered this encounter  Medications  . amLODipine (NORVASC) 10 MG tablet    Sig: Take 10 mg by mouth daily.  Marland Kitchen lidocaine-EPINEPHrine-tetracaine (LET) solution    Sig:   . silver sulfADIAZINE (SILVADENE) 1 % cream    Sig: Apply 1 application topically daily.    Dispense:  50 g    Refill:  0    Order Specific Question:  Supervising Provider    Answer:  Jake Michaelis, DAVID C [id number]  . ibuprofen (ADVIL,MOTRIN) 800 MG tablet    Sig: Take 1 tablet (800 mg total) by mouth 3 (three) times daily.    Dispense:  30 tablet    Refill:  0    Order Specific Question:  Supervising Provider    Answer:  Jake Michaelis, DAVID C [id number]  . HYDROcodone-acetaminophen (NORCO) 5-325 MG per tablet    Sig: Take 1 tablet by mouth every 6 (six) hours as needed for moderate pain.    Dispense:  15 tablet    Refill:  0    Order Specific Question:  Supervising Provider    Answer:  Jake Michaelis, DAVID C [[id number]]       Liam Graham, PA-C 06/19/13 1139

## 2013-06-21 NOTE — ED Provider Notes (Signed)
Medical screening examination/treatment/procedure(s) were performed by non-physician practitioner and as supervising physician I was immediately available for consultation/collaboration.  Leslee Homeavid Pratham Cassatt, M.D.  Reuben Likesavid C Orvil Faraone, MD 06/21/13 [phone]

## 2013-12-21 ENCOUNTER — Other Ambulatory Visit (HOSPITAL_COMMUNITY): Payer: Self-pay | Admitting: Nurse Practitioner

## 2013-12-21 DIAGNOSIS — Z1231 Encounter for screening mammogram for malignant neoplasm of breast: Secondary | ICD-10-CM

## 2014-01-16 ENCOUNTER — Other Ambulatory Visit (HOSPITAL_COMMUNITY): Payer: Self-pay | Admitting: Specialist

## 2014-01-16 ENCOUNTER — Ambulatory Visit (HOSPITAL_COMMUNITY)
Admission: RE | Admit: 2014-01-16 | Discharge: 2014-01-16 | Disposition: A | Discharge status: Home / Self Care | Payer: Medicaid Other | Source: Ambulatory Visit | Attending: Specialist | Admitting: Specialist

## 2014-01-16 ENCOUNTER — Ambulatory Visit (HOSPITAL_COMMUNITY): Admission: RE | Admit: 2014-01-16 | Payer: Self-pay | Source: Ambulatory Visit

## 2014-01-16 DIAGNOSIS — Z1231 Encounter for screening mammogram for malignant neoplasm of breast: Secondary | ICD-10-CM

## 2014-06-18 ENCOUNTER — Emergency Department (HOSPITAL_COMMUNITY)
Admission: EM | Admit: 2014-06-18 | Discharge: 2014-06-18 | Disposition: A | Discharge status: Home / Self Care | Payer: Medicaid Other | Attending: Emergency Medicine | Admitting: Emergency Medicine

## 2014-06-18 ENCOUNTER — Encounter (HOSPITAL_COMMUNITY): Payer: Self-pay | Admitting: Emergency Medicine

## 2014-06-18 DIAGNOSIS — W1849XA Other slipping, tripping and stumbling without falling, initial encounter: Secondary | ICD-10-CM | POA: Diagnosis not present

## 2014-06-18 DIAGNOSIS — S3992XA Unspecified injury of lower back, initial encounter: Secondary | ICD-10-CM | POA: Diagnosis present

## 2014-06-18 DIAGNOSIS — I10 Essential (primary) hypertension: Secondary | ICD-10-CM | POA: Diagnosis not present

## 2014-06-18 DIAGNOSIS — Y9289 Other specified places as the place of occurrence of the external cause: Secondary | ICD-10-CM | POA: Insufficient documentation

## 2014-06-18 DIAGNOSIS — M545 Low back pain, unspecified: Secondary | ICD-10-CM

## 2014-06-18 DIAGNOSIS — M62830 Muscle spasm of back: Secondary | ICD-10-CM | POA: Insufficient documentation

## 2014-06-18 DIAGNOSIS — Y990 Civilian activity done for income or pay: Secondary | ICD-10-CM | POA: Diagnosis not present

## 2014-06-18 DIAGNOSIS — G8911 Acute pain due to trauma: Secondary | ICD-10-CM | POA: Diagnosis not present

## 2014-06-18 DIAGNOSIS — G8929 Other chronic pain: Secondary | ICD-10-CM | POA: Diagnosis not present

## 2014-06-18 MED ORDER — TRAMADOL HCL 50 MG PO TABS: 50.0000 mg | ORAL_TABLET | Freq: Four times a day (QID) | ORAL | Status: DC | PRN

## 2014-06-18 MED ORDER — IBUPROFEN 600 MG PO TABS: 600.0000 mg | ORAL_TABLET | Freq: Four times a day (QID) | ORAL | Status: DC | PRN

## 2014-06-18 MED ORDER — KETOROLAC TROMETHAMINE 60 MG/2ML IM SOLN
60.0000 mg | Freq: Once | INTRAMUSCULAR | Status: AC
  Administered 2014-06-18: 60 mg via INTRAMUSCULAR
  Filled 2014-06-18: qty 2

## 2014-06-18 MED ORDER — CYCLOBENZAPRINE HCL 10 MG PO TABS: 10.0000 mg | ORAL_TABLET | Freq: Two times a day (BID) | ORAL | Status: DC | PRN

## 2014-06-18 NOTE — ED Notes (Signed)
Patient refused ice.  

## 2014-06-18 NOTE — ED Notes (Signed)
Pt verbalizes understanding of d/c instructions and denies any further needs at this time. 

## 2014-06-18 NOTE — ED Notes (Signed)
Pt. tripped on a walker lost her balance and injured her lower back this evening , denies falling on the ground , alert and oriented /ambulatory.

## 2014-06-18 NOTE — ED Provider Notes (Signed)
CSN: [id number]     Arrival date & time 06/18/14  1942 History   This chart was scribed for non-physician practitioner, Junius Finner, PA-C working with Blake Divine, MD by Doreatha Martin, ED scribe. This patient was seen in room TR07C/TR07C and the patient's care was started at 8:11 PM    Chief Complaint  Patient presents with  . Back Pain   The history is provided by the patient. No language interpreter was used.    HPI Comments: Sabrina Nguyen is a 54 y.o. female with Hx of ruptured disc and HTN who presents to the Emergency Department complaining of moderate, sudden onset, sharp lower back pain onset at 1800 this evening. Pt states that she tripped over a walker at work and twisted her back.  Denies falling from the trip. Denies any other injuries.  Pt is followed by an orthopedist for her ruptured disc and takes Lyrica at home for her pain. Pt is also requesting a work note and worker's compensation paperwork. She denies Hx of back surgery, LOC, fall, heavy lifting, and other injuries. She also denies numbness, tingling, and pain in the arms.    Past Medical History  Diagnosis Date  . Hypertension    History reviewed. No pertinent past surgical history. No family history on file. History  Substance Use Topics  . Smoking status: Never Smoker   . Smokeless tobacco: Not on file  . Alcohol Use: Yes   OB History    No data available     Review of Systems  Musculoskeletal: Positive for back pain and arthralgias.  Neurological: Negative for numbness.  All other systems reviewed and are negative.  Allergies  Review of patient's allergies indicates no known allergies.  Home Medications   Prior to Admission medications   Medication Sig Start Date End Date Taking? Authorizing Provider  cyclobenzaprine (FLEXERIL) 10 MG tablet Take 1 tablet (10 mg total) by mouth 2 (two) times daily as needed for muscle spasms. 06/18/14   Junius Finner, PA-C  ibuprofen (ADVIL,MOTRIN) 600 MG tablet  Take 1 tablet (600 mg total) by mouth every 6 (six) hours as needed. 06/18/14   Junius Finner, PA-C  traMADol (ULTRAM) 50 MG tablet Take 1 tablet (50 mg total) by mouth every 6 (six) hours as needed. 06/18/14   Junius Finner, PA-C   BP 103/65 mmHg  Pulse 75  Temp(Src) 98.2 F (36.8 C) (Oral)  Resp 16  Ht 5\' 4"  (1.626 m)  Wt 145 lb (65.772 kg)  BMI 24.88 kg/m2  SpO2 100% Physical Exam  Constitutional: She is oriented to person, place, and time. She appears well-developed and well-nourished.  HENT:  Head: Normocephalic and atraumatic.  Eyes: EOM are normal. Pupils are equal, round, and reactive to light.  Neck: Normal range of motion.  Cardiovascular: Normal rate, regular rhythm and normal heart sounds.   Pulses:      Radial pulses are 2+ on the right side, and 2+ on the left side.       Dorsalis pedis pulses are 2+ on the right side, and 2+ on the left side.  Pulmonary/Chest: Effort normal and breath sounds normal. No respiratory distress.  Abdominal: Soft. There is no tenderness.  Musculoskeletal: Normal range of motion. She exhibits tenderness.  Thoracic and lumbar tenderness with paraspinal and lumbar muscle tenderness.   Neurological: She is alert and oriented to person, place, and time.  Sensation in tact, symmetric in all extremities.  Skin: Skin is warm and dry.  Psychiatric: She has  a normal mood and affect. Her behavior is normal.  Nursing note and vitals reviewed.   ED Course  Procedures (including critical care time) DIAGNOSTIC STUDIES: Oxygen Saturation is 100% on RA, normal by my interpretation.    COORDINATION OF CARE: 8:14 PM Discussed treatment plan with pt at bedside and pt agreed to plan.   Labs Review Labs Reviewed - No data to display  Imaging Review No results found.   EKG Interpretation None      MDM   Final diagnoses:  Acute exacerbation of chronic low back pain  Muscle spasm of back    Pt is a 54yo female with hx of ruptured disc,  presenting to ED with c/o exacerbation of chronic back pain after tripping but no fall. No other injuries. No focal neuro deficit. Pt has palpable muscle spasms and tenderness of thoracic and lumbar regions of back. FROM upper and lower extremities with neurovascularly in tact.    Do not believe imaging needed at this time. Not concerned for emergent process taking place. Will tx symptomatically as needed for pain.  Rx: tramadol, ibuprofen and flexeril. Home care instructions provided.  Work note provided. Home care instructions provided. Advised to f/u with PCP and Ortho. Return precautions provided. Pt verbalized understanding and agreement with tx plan.   I personally performed the services described in this documentation, which was scribed in my presence. The recorded information has been reviewed and is accurate.   Junius Finner, PA-C 06/18/14 2119  Blake Divine, MD 06/18/14 [phone]

## 2014-06-19 ENCOUNTER — Encounter (HOSPITAL_COMMUNITY): Payer: Self-pay | Admitting: Emergency Medicine

## 2014-08-13 ENCOUNTER — Other Ambulatory Visit: Payer: Self-pay | Admitting: Nurse Practitioner

## 2014-08-13 DIAGNOSIS — N644 Mastodynia: Secondary | ICD-10-CM

## 2014-08-16 ENCOUNTER — Other Ambulatory Visit: Payer: Self-pay

## 2014-08-24 ENCOUNTER — Ambulatory Visit
Admission: RE | Admit: 2014-08-24 | Discharge: 2014-08-24 | Disposition: A | Discharge status: Home / Self Care | Payer: Medicaid Other | Source: Ambulatory Visit | Attending: Nurse Practitioner | Admitting: Nurse Practitioner

## 2014-08-24 DIAGNOSIS — N644 Mastodynia: Secondary | ICD-10-CM

## 2015-02-06 ENCOUNTER — Encounter (HOSPITAL_COMMUNITY): Payer: Self-pay

## 2015-02-06 ENCOUNTER — Emergency Department (HOSPITAL_COMMUNITY)
Admission: EM | Admit: 2015-02-06 | Discharge: 2015-02-06 | Disposition: A | Discharge status: Home / Self Care | Payer: Medicaid Other | Source: Home / Self Care | Attending: Family Medicine | Admitting: Family Medicine

## 2015-02-06 ENCOUNTER — Other Ambulatory Visit (HOSPITAL_COMMUNITY)
Admission: RE | Admit: 2015-02-06 | Discharge: 2015-02-06 | Disposition: A | Discharge status: Home / Self Care | Payer: Medicaid Other | Source: Ambulatory Visit | Attending: Family Medicine | Admitting: Family Medicine

## 2015-02-06 DIAGNOSIS — L02415 Cutaneous abscess of right lower limb: Secondary | ICD-10-CM | POA: Diagnosis not present

## 2015-02-06 DIAGNOSIS — L0291 Cutaneous abscess, unspecified: Secondary | ICD-10-CM | POA: Diagnosis not present

## 2015-02-06 MED ORDER — SULFAMETHOXAZOLE-TRIMETHOPRIM 800-160 MG PO TABS: 1.0000 | ORAL_TABLET | Freq: Two times a day (BID) | ORAL | Status: AC

## 2015-02-06 NOTE — ED Provider Notes (Signed)
CSN: [id number]     Arrival date & time 02/06/15  1752 History   First MD Initiated Contact with Patient 02/06/15 2002     Chief Complaint  Patient presents with  . Insect Bite   (Consider location/radiation/quality/duration/timing/severity/associated sxs/prior Treatment) HPI History obtained from patient:   LOCATION:right thigh SEVERITY:6 DURATION:3 days CONTEXT:  Sudden onset, after exposure to sister in the hospital with MRSA QUALITY:ache, spider bite MODIFYING FACTORS:none ASSOCIATED SYMPTOMS:pain in t high TIMING:constant OCCUPATION:  Past Medical History  Diagnosis Date  . Ruptured disk   . Hypertension    History reviewed. No pertinent past surgical history. No family history on file. Social History  Substance Use Topics  . Smoking status: Never Smoker   . Smokeless tobacco: None  . Alcohol Use: Yes   OB History    Gravida Para Term Preterm AB TAB SAB Ectopic Multiple Living   0 0 0 0 0 0 0 0       Review of Systems ROS +'ve right thigh redness, swelling  Denies: HEADACHE, NAUSEA, ABDOMINAL PAIN, CHEST PAIN, CONGESTION, DYSURIA, SHORTNESS OF BREATH  Allergies  Latex  Home Medications   Prior to Admission medications   Medication Sig Start Date End Date Taking? Authorizing Provider  amLODipine (NORVASC) 10 MG tablet Take 10 mg by mouth daily.    Historical Provider, MD  cyclobenzaprine (FLEXERIL) 10 MG tablet Take 10 mg by mouth 2 (two) times daily as needed for muscle spasms.    Historical Provider, MD  cyclobenzaprine (FLEXERIL) 10 MG tablet Take 1 tablet (10 mg total) by mouth 2 (two) times daily as needed for muscle spasms. 06/18/14   Noland Fordyce, PA-C  HYDROcodone-acetaminophen (NORCO) 5-325 MG per tablet Take 1 tablet by mouth every 6 (six) hours as needed for moderate pain. 06/19/13   Liam Graham, PA-C  ibuprofen (ADVIL,MOTRIN) 600 MG tablet Take 1 tablet (600 mg total) by mouth every 6 (six) hours as needed. 06/18/14   Noland Fordyce, PA-C    ibuprofen (ADVIL,MOTRIN) 800 MG tablet Take 1 tablet (800 mg total) by mouth 3 (three) times daily. 06/19/13   Freeman Caldron Baker, PA-C  LISINOPRIL PO Take 20-25 mg by mouth.     Historical Provider, MD  PredniSONE 10 MG KIT 12 day dose pack po 03/31/13   Gregor Hams, MD  silver sulfADIAZINE (SILVADENE) 1 % cream Apply 1 application topically daily. 06/19/13   Liam Graham, PA-C  sulfamethoxazole-trimethoprim (BACTRIM DS,SEPTRA DS) 800-160 MG tablet Take 1 tablet by mouth 2 (two) times daily. 02/06/15 02/13/15  Konrad Felix, PA  traMADol (ULTRAM) 50 MG tablet Take 1 tablet (50 mg total) by mouth every 6 (six) hours as needed. 03/31/13   Gregor Hams, MD  traMADol (ULTRAM) 50 MG tablet Take 1 tablet (50 mg total) by mouth every 6 (six) hours as needed. 06/18/14   Noland Fordyce, PA-C   Meds Ordered and Administered this Visit  Medications - No data to display  BP 119/73 mmHg  Pulse 91  Temp(Src) 98 F (36.7 C) (Oral)  Resp 18  SpO2 100% No data found.   Physical Exam  Constitutional: She appears well-developed and well-nourished. No distress.  HENT:  Head: Normocephalic.  Pulmonary/Chest: Effort normal.  Musculoskeletal: Normal range of motion.       Legs: Nursing note and vitals reviewed.   ED Course  .Marland KitchenIncision and Drainage Date/Time: 02/06/2015 8:14 PM Performed by: Konrad Felix Authorized by: Ihor Gully D Consent: Verbal consent obtained. Risks  and benefits: risks, benefits and alternatives were discussed Consent given by: patient Patient identity confirmed: verbally with patient and arm band Type: abscess Body area: lower extremity Location details: right leg Local anesthetic: co-phenylcaine spray Patient sedated: no Needle gauge: 18 Incision type: single straight Incision depth: subcutaneous Drainage: purulent Drainage amount: moderate (5 mL) Patient tolerance: Patient tolerated the procedure well with no immediate complications Comments: Wound culture  obtained   (including critical care time)  Labs Review Labs Reviewed  WOUND CULTURE    Imaging Review No results found.   Visual Acuity Review  Right Eye Distance:   Left Eye Distance:   Bilateral Distance:    Right Eye Near:   Left Eye Near:    Bilateral Near:         MDM   1. Abscess    Patient is advised to continue home symptomatic treatment. Prescription for Bactrim  sent pharmacy patient has indicated. Patient is advised that if there are new or worsening symptoms or attend the emergency department, or contact primary care provider. Instructions of care provided discharged home in stable condition. Return to work/school note provided.  THIS NOTE WAS GENERATED USING A VOICE RECOGNITION SOFTWARE PROGRAM. ALL REASONABLE EFFORTS  WERE MADE TO PROOFREAD THIS DOCUMENT FOR ACCURACY.     Konrad Felix, [place] 02/06/15 2015

## 2015-02-06 NOTE — ED Notes (Signed)
Patient complains of some type of "bite" to her right upper thigh The area has gotten bigger red and hot to the touch The center is draining some white fluid

## 2015-02-06 NOTE — Discharge Instructions (Signed)
Abscess °An abscess (boil or furuncle) is an infected area on or under the skin. This area is filled with yellowish-white fluid (pus) and other material (debris). °HOME CARE  °· Only take medicines as told by your doctor. °· If you were given antibiotic medicine, take it as directed. Finish the medicine even if you start to feel better. °· If gauze is used, follow your doctor's directions for changing the gauze. °· To avoid spreading the infection: °¨ Keep your abscess covered with a bandage. °¨ Wash your hands well. °¨ Do not share personal care items, towels, or whirlpools with others. °¨ Avoid skin contact with others. °· Keep your skin and clothes clean around the abscess. °· Keep all doctor visits as told. °GET HELP RIGHT AWAY IF:  °· You have more pain, puffiness (swelling), or redness in the wound site. °· You have more fluid or blood coming from the wound site. °· You have muscle aches, chills, or you feel sick. °· You have a fever. °MAKE SURE YOU:  °· Understand these instructions. °· Will watch your condition. °· Will get help right away if you are not doing well or get worse. °  °This information is not intended to replace advice given to you by your health care provider. Make sure you discuss any questions you have with your health care provider. °  °Document Released: 06/10/2007 Document Revised: 06/23/2011 Document Reviewed: 03/07/2011 °Elsevier Interactive Patient Education ©2016 [employer] ° °

## 2015-02-09 LAB — WOUND CULTURE

## 2015-03-13 ENCOUNTER — Emergency Department (HOSPITAL_COMMUNITY): Payer: Medicaid Other

## 2015-03-13 ENCOUNTER — Emergency Department (HOSPITAL_COMMUNITY)
Admission: EM | Admit: 2015-03-13 | Discharge: 2015-03-13 | Disposition: A | Discharge status: Home / Self Care | Payer: Medicaid Other | Attending: Emergency Medicine | Admitting: Emergency Medicine

## 2015-03-13 ENCOUNTER — Encounter (HOSPITAL_COMMUNITY): Payer: Self-pay | Admitting: *Deleted

## 2015-03-13 DIAGNOSIS — I10 Essential (primary) hypertension: Secondary | ICD-10-CM | POA: Insufficient documentation

## 2015-03-13 DIAGNOSIS — D649 Anemia, unspecified: Secondary | ICD-10-CM | POA: Diagnosis not present

## 2015-03-13 DIAGNOSIS — R0789 Other chest pain: Secondary | ICD-10-CM | POA: Diagnosis not present

## 2015-03-13 DIAGNOSIS — Z8739 Personal history of other diseases of the musculoskeletal system and connective tissue: Secondary | ICD-10-CM | POA: Diagnosis not present

## 2015-03-13 DIAGNOSIS — R079 Chest pain, unspecified: Secondary | ICD-10-CM | POA: Diagnosis present

## 2015-03-13 DIAGNOSIS — R05 Cough: Secondary | ICD-10-CM | POA: Insufficient documentation

## 2015-03-13 DIAGNOSIS — Z79899 Other long term (current) drug therapy: Secondary | ICD-10-CM | POA: Insufficient documentation

## 2015-03-13 DIAGNOSIS — Z91040 Latex allergy status: Secondary | ICD-10-CM | POA: Diagnosis not present

## 2015-03-13 LAB — D-DIMER, QUANTITATIVE (NOT AT ARMC): D DIMER QUANT: 0.39 ug{FEU}/mL (ref 0.00–0.50)

## 2015-03-13 LAB — BASIC METABOLIC PANEL
Anion gap: 14 (ref 5–15)
BUN: 23 mg/dL — AB (ref 6–20)
CHLORIDE: 104 mmol/L (ref 101–111)
CO2: 24 mmol/L (ref 22–32)
CREATININE: 1.03 mg/dL — AB (ref 0.44–1.00)
Calcium: 9.8 mg/dL (ref 8.9–10.3)
GFR calc Af Amer: >60 mL/min (ref >60)
GFR calc non Af Amer: >60 mL/min (ref >60)
Glucose, Bld: 101 mg/dL — ABNORMAL HIGH (ref 65–99)
POTASSIUM: 3.5 mmol/L (ref 3.5–5.1)
SODIUM: 142 mmol/L (ref 135–145)

## 2015-03-13 LAB — CBC
HCT: 31.3 % — ABNORMAL LOW (ref 36.0–46.0)
HEMOGLOBIN: 10.3 g/dL — AB (ref 12.0–15.0)
MCH: 29.2 pg (ref 26.0–34.0)
MCHC: 32.9 g/dL (ref 30.0–36.0)
MCV: 88.7 fL (ref 78.0–100.0)
Platelets: 259 10*3/uL (ref 150–400)
RBC: 3.53 MIL/uL — AB (ref 3.87–5.11)
RDW: 13.7 % (ref 11.5–15.5)
WBC: 6 10*3/uL (ref 4.0–10.5)

## 2015-03-13 LAB — I-STAT TROPONIN, ED: TROPONIN I, POC: 0 ng/mL (ref 0.00–0.08)

## 2015-03-13 MED ORDER — BENZONATATE 100 MG PO CAPS: 100.0000 mg | ORAL_CAPSULE | Freq: Three times a day (TID) | ORAL | Status: DC

## 2015-03-13 MED ORDER — IBUPROFEN 800 MG PO TABS: 800.0000 mg | ORAL_TABLET | Freq: Three times a day (TID) | ORAL | Status: DC

## 2015-03-13 MED ORDER — IBUPROFEN 800 MG PO TABS
800.0000 mg | ORAL_TABLET | Freq: Once | ORAL | Status: AC
  Administered 2015-03-13: 800 mg via ORAL
  Filled 2015-03-13: qty 1

## 2015-03-13 NOTE — Discharge Instructions (Signed)
Come back to the ER if you develop worsening symptoms See your doctor in 2 days for recheck  Motrin 3 times daily for chest pain Tessalon as needed every 8 hours for coughing

## 2015-03-13 NOTE — ED Notes (Signed)
Pt reports cough and chest pain since Monday night. Pt states that pain is in the center of her chest. Pt denies any other symptoms.

## 2015-03-13 NOTE — ED Provider Notes (Signed)
CSN: [id number]     Arrival date & time 03/13/15  1147 History   First MD Initiated Contact with Patient 03/13/15 1646     Chief Complaint  Patient presents with  . Chest Pain     (Consider location/radiation/quality/duration/timing/severity/associated sxs/prior Treatment) HPI  The patient is a 55 year old female, she does have a history of high blood pressure and a ruptured disc in her lower back, she is on several medications for blood pressure including lisinopril. She presents after having 2 days of cough with substernal chest pain which she describes as a pinching sensation, worse with deep breathing, worse with palpation and worse with coughing. The cough is somewhat productive of a small amount of phlegm but there is no other risk factors for pulmonary embolism, no recent surgery though she did undergo an epidural injection several months ago. She reports that she has been active, she does not smoke cigarettes, she does not use estrogen containing medications, she has not had recent travel, trauma or immobilization. This pain is relatively constant over the last couple of days though seems to be worse with deep breathing and coughing. She denies fevers or leg swelling  Past Medical History  Diagnosis Date  . Ruptured disk   . Hypertension    History reviewed. No pertinent past surgical history. No family history on file. Social History  Substance Use Topics  . Smoking status: Never Smoker   . Smokeless tobacco: None  . Alcohol Use: Yes   OB History    Gravida Para Term Preterm AB TAB SAB Ectopic Multiple Living   0 0 0 0 0 0 0 0       Review of Systems  All other systems reviewed and are negative.     Allergies  Latex  Home Medications   Prior to Admission medications   Medication Sig Start Date End Date Taking? Authorizing Provider  amLODipine (NORVASC) 10 MG tablet Take 10 mg by mouth daily.   Yes Historical Provider, MD  lisinopril (PRINIVIL,ZESTRIL) 20 MG  tablet Take 20 mg by mouth daily.   Yes Historical Provider, MD  pregabalin (LYRICA) 75 MG capsule Take 75 mg by mouth daily as needed (nerve pain).   Yes Historical Provider, MD  benzonatate (TESSALON) 100 MG capsule Take 1 capsule (100 mg total) by mouth every 8 (eight) hours. 03/13/15   Eber HongBrian Leland Raver, MD  ibuprofen (ADVIL,MOTRIN) 800 MG tablet Take 1 tablet (800 mg total) by mouth 3 (three) times daily. 03/13/15   Eber HongBrian Di Jasmer, MD   BP 123/71 mmHg  Pulse 88  Temp(Src) 99.1 F (37.3 C) (Oral)  Resp 15  SpO2 99% Physical Exam  Constitutional: She appears well-developed and well-nourished. No distress.  HENT:  Head: Normocephalic and atraumatic.  Mouth/Throat: Oropharynx is clear and moist. No oropharyngeal exudate.  Eyes: Conjunctivae and EOM are normal. Pupils are equal, round, and reactive to light. Right eye exhibits no discharge. Left eye exhibits no discharge. No scleral icterus.  Neck: Normal range of motion. Neck supple. No JVD present. No thyromegaly present.  Cardiovascular: Normal rate, regular rhythm, normal heart sounds and intact distal pulses.  Exam reveals no gallop and no friction rub.   No murmur heard. Pulmonary/Chest: Effort normal and breath sounds normal. No respiratory distress. She has no wheezes. She has no rales. She exhibits tenderness ( Reproducible tenderness over the chest wall to even light palpation).  Abdominal: Soft. Bowel sounds are normal. She exhibits no distension and no mass. There is no tenderness.  Musculoskeletal: Normal range of motion. She exhibits no edema or tenderness.  Lymphadenopathy:    She has no cervical adenopathy.  Neurological: She is alert. Coordination normal.  Skin: Skin is warm and dry. No rash noted. No erythema.  Psychiatric: She has a normal mood and affect. Her behavior is normal.  Nursing note and vitals reviewed.   ED Course  Procedures (including critical care time) Labs Review Labs Reviewed  BASIC METABOLIC PANEL -  Abnormal; Notable for the following:    Glucose, Bld 101 (*)    BUN 23 (*)    Creatinine, Ser 1.03 (*)    All other components within normal limits  CBC - Abnormal; Notable for the following:    RBC 3.53 (*)    Hemoglobin 10.3 (*)    HCT 31.3 (*)    All other components within normal limits  D-DIMER, QUANTITATIVE (NOT AT [facility])  Rosezena Sensor, ED    Imaging Review Dg Chest 2 View  03/13/2015  CLINICAL DATA:  Nonproductive cough and chest pain for 2 days EXAM: CHEST  2 VIEW COMPARISON:  03/15/2013 FINDINGS: The heart size and mediastinal contours are within normal limits. Both lungs are clear. The visualized skeletal structures are unremarkable. IMPRESSION: No active cardiopulmonary disease. Electronically Signed   By: Esperanza Heir M.D.   On: 03/13/2015 12:31   I have personally reviewed and evaluated these images and lab results as part of my medical decision-making.   EKG Interpretation   Date/Time:  Wednesday March 13 2015 11:58:14 EST Ventricular Rate:  93 PR Interval:  116 QRS Duration: 66 QT Interval:  344 QTC Calculation: 427 R Axis:   80 Text Interpretation:  Sinus rhythm with occasional Premature ventricular  complexes Otherwise normal ECG Since last tracing Premature ventricular  complexes now present. Confirmed by Hyacinth Meeker  MD, Jarryn Altland ([id number]) on 03/13/2015  5:16:46 PM      MDM   Final diagnoses:  Chest pain, unspecified chest pain type  Anemia, unspecified anemia type    The patient is well-appearing, vital signs are totally normal, there is no tachycardia on repeat exam though there was on initial arrival. There is no hypoxia and normal blood pressure. Her chest x-ray is unremarkable, her lab work is also unremarkable with no leukocytosis though she does have a mild anemia. Her creatinine is normal, electrolyte are normal.  Add d-dimer - otherwise may be pleurisy.  D-dimer was negative, vital signs are normal, the patient has ongoing reproducible tenderness  over the chest wall. I have discussed all of her results with her at the bedside, she appears stable for discharge, she does have some pleuritic pain and chest wall pain which can be treated with anti-inflammatories. I have explained to her that her anemia needs to be evaluated by her family doctor, hemoglobin needs to be rechecked, she is agreeable to this plan. She denies any bleeding intravaginally, rectally, no nosebleeds, her gums do not bleed when she brushes her teeth, she denies bruising easily.  Eber Hong, MD 03/13/15 [phone]

## 2015-05-23 ENCOUNTER — Encounter: Payer: Self-pay | Admitting: Specialist

## 2015-06-07 ENCOUNTER — Encounter: Payer: Self-pay | Admitting: Specialist

## 2015-07-03 ENCOUNTER — Encounter (HOSPITAL_COMMUNITY): Payer: Self-pay | Admitting: Emergency Medicine

## 2015-07-03 ENCOUNTER — Emergency Department (HOSPITAL_COMMUNITY): Payer: Medicaid Other

## 2015-07-03 ENCOUNTER — Emergency Department (HOSPITAL_COMMUNITY)
Admission: EM | Admit: 2015-07-03 | Discharge: 2015-07-03 | Disposition: A | Discharge status: Home / Self Care | Payer: Medicaid Other | Attending: Emergency Medicine | Admitting: Emergency Medicine

## 2015-07-03 DIAGNOSIS — I10 Essential (primary) hypertension: Secondary | ICD-10-CM | POA: Insufficient documentation

## 2015-07-03 DIAGNOSIS — Z79899 Other long term (current) drug therapy: Secondary | ICD-10-CM | POA: Insufficient documentation

## 2015-07-03 DIAGNOSIS — Z91040 Latex allergy status: Secondary | ICD-10-CM | POA: Diagnosis not present

## 2015-07-03 DIAGNOSIS — R079 Chest pain, unspecified: Secondary | ICD-10-CM | POA: Diagnosis not present

## 2015-07-03 LAB — BASIC METABOLIC PANEL
Anion gap: 9 (ref 5–15)
BUN: 19 mg/dL (ref 6–20)
CO2: 26 mmol/L (ref 22–32)
Calcium: 9.8 mg/dL (ref 8.9–10.3)
Chloride: 107 mmol/L (ref 101–111)
Creatinine, Ser: 0.98 mg/dL (ref 0.44–1.00)
GFR calc Af Amer: >60 mL/min (ref >60)
GFR calc non Af Amer: >60 mL/min (ref >60)
Glucose, Bld: 113 mg/dL — ABNORMAL HIGH (ref 65–99)
Potassium: 3.5 mmol/L (ref 3.5–5.1)
Sodium: 142 mmol/L (ref 135–145)

## 2015-07-03 LAB — CBC
HCT: 37.3 % (ref 36.0–46.0)
Hemoglobin: 12.5 g/dL (ref 12.0–15.0)
MCH: 29.3 pg (ref 26.0–34.0)
MCHC: 33.5 g/dL (ref 30.0–36.0)
MCV: 87.6 fL (ref 78.0–100.0)
Platelets: 346 10*3/uL (ref 150–400)
RBC: 4.26 MIL/uL (ref 3.87–5.11)
RDW: 12.7 % (ref 11.5–15.5)
WBC: 7.7 10*3/uL (ref 4.0–10.5)

## 2015-07-03 LAB — I-STAT TROPONIN, ED
TROPONIN I, POC: 0 ng/mL (ref 0.00–0.08)
Troponin i, poc: 0 ng/mL (ref 0.00–0.08)

## 2015-07-03 NOTE — ED Notes (Signed)
Pt wa sitting at dining rom at work and had a sudden onset of a sharp tingling" pain in the center of her chest. Pt denies any sob. Pt is warm and dry and no distress.

## 2015-07-03 NOTE — ED Provider Notes (Signed)
CSN: [id number]     Arrival date & time 07/03/15  1305 History   First MD Initiated Contact with Patient 07/03/15 1625     Chief Complaint  Patient presents with  . Chest Pain     HPI  Patient resists evaluation of chest pain. States she was sitting this morning at work eating. Talk with some friends. Developed tingling sensation in her left anterior chest that radiated to her left hand. It is persisted since that time and slowly improved from a 4/10 to a 2 or 1. No associated symptoms such as shortness of breath, nausea, dizziness, patient's. No GI complaints or recent food intolerances. No unusual dietary intake at the time.  No recent exercise intolerance or dyspnea on exertion. No PND orthopnea. No cough sputum production difficult breathing fever. No edema.  No family history of heart disease in parents and brothers and sisters. Hypertension. Normally well controlled on Norvasc and Prinivil. No history of diabetes or hypercholesterolemia. Lifetime nonsmoker.  Past Medical History  Diagnosis Date  . Ruptured disk   . Hypertension    History reviewed. No pertinent past surgical history. History reviewed. No pertinent family history. Social History  Substance Use Topics  . Smoking status: Never Smoker   . Smokeless tobacco: None  . Alcohol Use: Yes   OB History    Gravida Para Term Preterm AB TAB SAB Ectopic Multiple Living   0 0 0 0 0 0 0 0       Review of Systems  Constitutional: Negative for fever, chills, diaphoresis, appetite change and fatigue.  HENT: Negative for mouth sores, sore throat and trouble swallowing.   Eyes: Negative for visual disturbance.  Respiratory: Negative for cough, chest tightness, shortness of breath and wheezing.   Cardiovascular: Positive for chest pain.  Gastrointestinal: Negative for nausea, vomiting, abdominal pain, diarrhea and abdominal distention.  Endocrine: Negative for polydipsia, polyphagia and polyuria.  Genitourinary: Negative for  dysuria, frequency and hematuria.  Musculoskeletal: Negative for gait problem.  Skin: Negative for color change, pallor and rash.  Neurological: Negative for dizziness, syncope, light-headedness and headaches.  Hematological: Does not bruise/bleed easily.  Psychiatric/Behavioral: Negative for behavioral problems and confusion.      Allergies  Latex  Home Medications   Prior to Admission medications   Medication Sig Start Date End Date Taking? Authorizing Provider  amLODipine (NORVASC) 10 MG tablet Take 10 mg by mouth daily.    Historical Provider, MD  benzonatate (TESSALON) 100 MG capsule Take 1 capsule (100 mg total) by mouth every 8 (eight) hours. 03/13/15   Eber HongBrian Miller, MD  ibuprofen (ADVIL,MOTRIN) 800 MG tablet Take 1 tablet (800 mg total) by mouth 3 (three) times daily. 03/13/15   Eber HongBrian Miller, MD  lisinopril (PRINIVIL,ZESTRIL) 20 MG tablet Take 20 mg by mouth daily.    Historical Provider, MD  pregabalin (LYRICA) 75 MG capsule Take 75 mg by mouth daily as needed (nerve pain).    Historical Provider, MD   BP 127/69 mmHg  Pulse 74  Temp(Src) 98 F (36.7 C) (Oral)  Resp 18  SpO2 100% Physical Exam  Constitutional: She is oriented to person, place, and time. She appears well-developed and well-nourished. No distress.  HENT:  Head: Normocephalic.  Eyes: Conjunctivae are normal. Pupils are equal, round, and reactive to light. No scleral icterus.  Neck: Normal range of motion. Neck supple. No thyromegaly present.  Cardiovascular: Normal rate and regular rhythm.  Exam reveals no gallop and no friction rub.   No murmur  heard. Pulmonary/Chest: Effort normal and breath sounds normal. No respiratory distress. She has no wheezes. She has no rales.  Abdominal: Soft. Bowel sounds are normal. She exhibits no distension. There is no tenderness. There is no rebound.  Musculoskeletal: Normal range of motion.  Neurological: She is alert and oriented to person, place, and time.  Skin: Skin is  warm and dry. No rash noted.  Psychiatric: She has a normal mood and affect. Her behavior is normal.    ED Course  Procedures (including critical care time) Labs Review Labs Reviewed  BASIC METABOLIC PANEL - Abnormal; Notable for the following:    Glucose, Bld 113 (*)    All other components within normal limits  CBC  I-STAT TROPOININ, ED  Rosezena SensorI-STAT TROPOININ, ED    Imaging Review Dg Chest 2 View  07/03/2015  CLINICAL DATA:  Sudden onset of chest pain. EXAM: CHEST  2 VIEW COMPARISON:  03/13/2015 FINDINGS: Heart and mediastinal contours are within normal limits. No focal opacities or effusions. No acute bony abnormality. IMPRESSION: No active cardiopulmonary disease. Electronically Signed   By: Charlett NoseKevin  Dover M.D.   On: 07/03/2015 13:47   I have personally reviewed and evaluated these images and lab results as part of my medical decision-making.   EKG Interpretation   Date/Time:  Wednesday July 03 2015 13:07:51 EDT Ventricular Rate:  80 PR Interval:  114 QRS Duration: 74 QT Interval:  386 QTC Calculation: 445 R Axis:   62 Text Interpretation:  Normal sinus rhythm Slow R progression No ST changes  No ectopy Confirmed by Fayrene FearingJAMES  MD, Higinio Grow ([id number]) on 07/03/2015 4:39:33 PM      MDM   Final diagnoses:  None    Heart score of 2. One point for age, one point for history of hypertension. First enzymes normal. Awaight delta troponin. Anticipate discharge if normal.  18:47:  Remains symptom free. Second troponin normal after almost 9 hours of symptoms. Appropriate for discharge. Hemato-course stable. No back pain. Normal blood pressures. Not hypoxemic or tachycardic. No risk for PE. No signs of ACS after 9 hours of symptoms and normal EKG and troponins. Advise primary care follow-up. Naproxen or Motrin over-the-counter. Recheck as needed.    Rolland PorterMark Preslyn Warr, MD 07/03/15 [phone]

## 2015-07-03 NOTE — Discharge Instructions (Signed)
Motrin as needed.  Follow-up with her primary care physician.  Return to ER with any new or worsening symptoms.

## 2017-04-24 IMAGING — DX DG CHEST 2V
2 series · 2 of 2 positions shown · non-contrast
Comparison: 03/13/2015

CLINICAL DATA: Sudden onset of chest pain.

EXAM:
CHEST  2 VIEW

[chest pa]
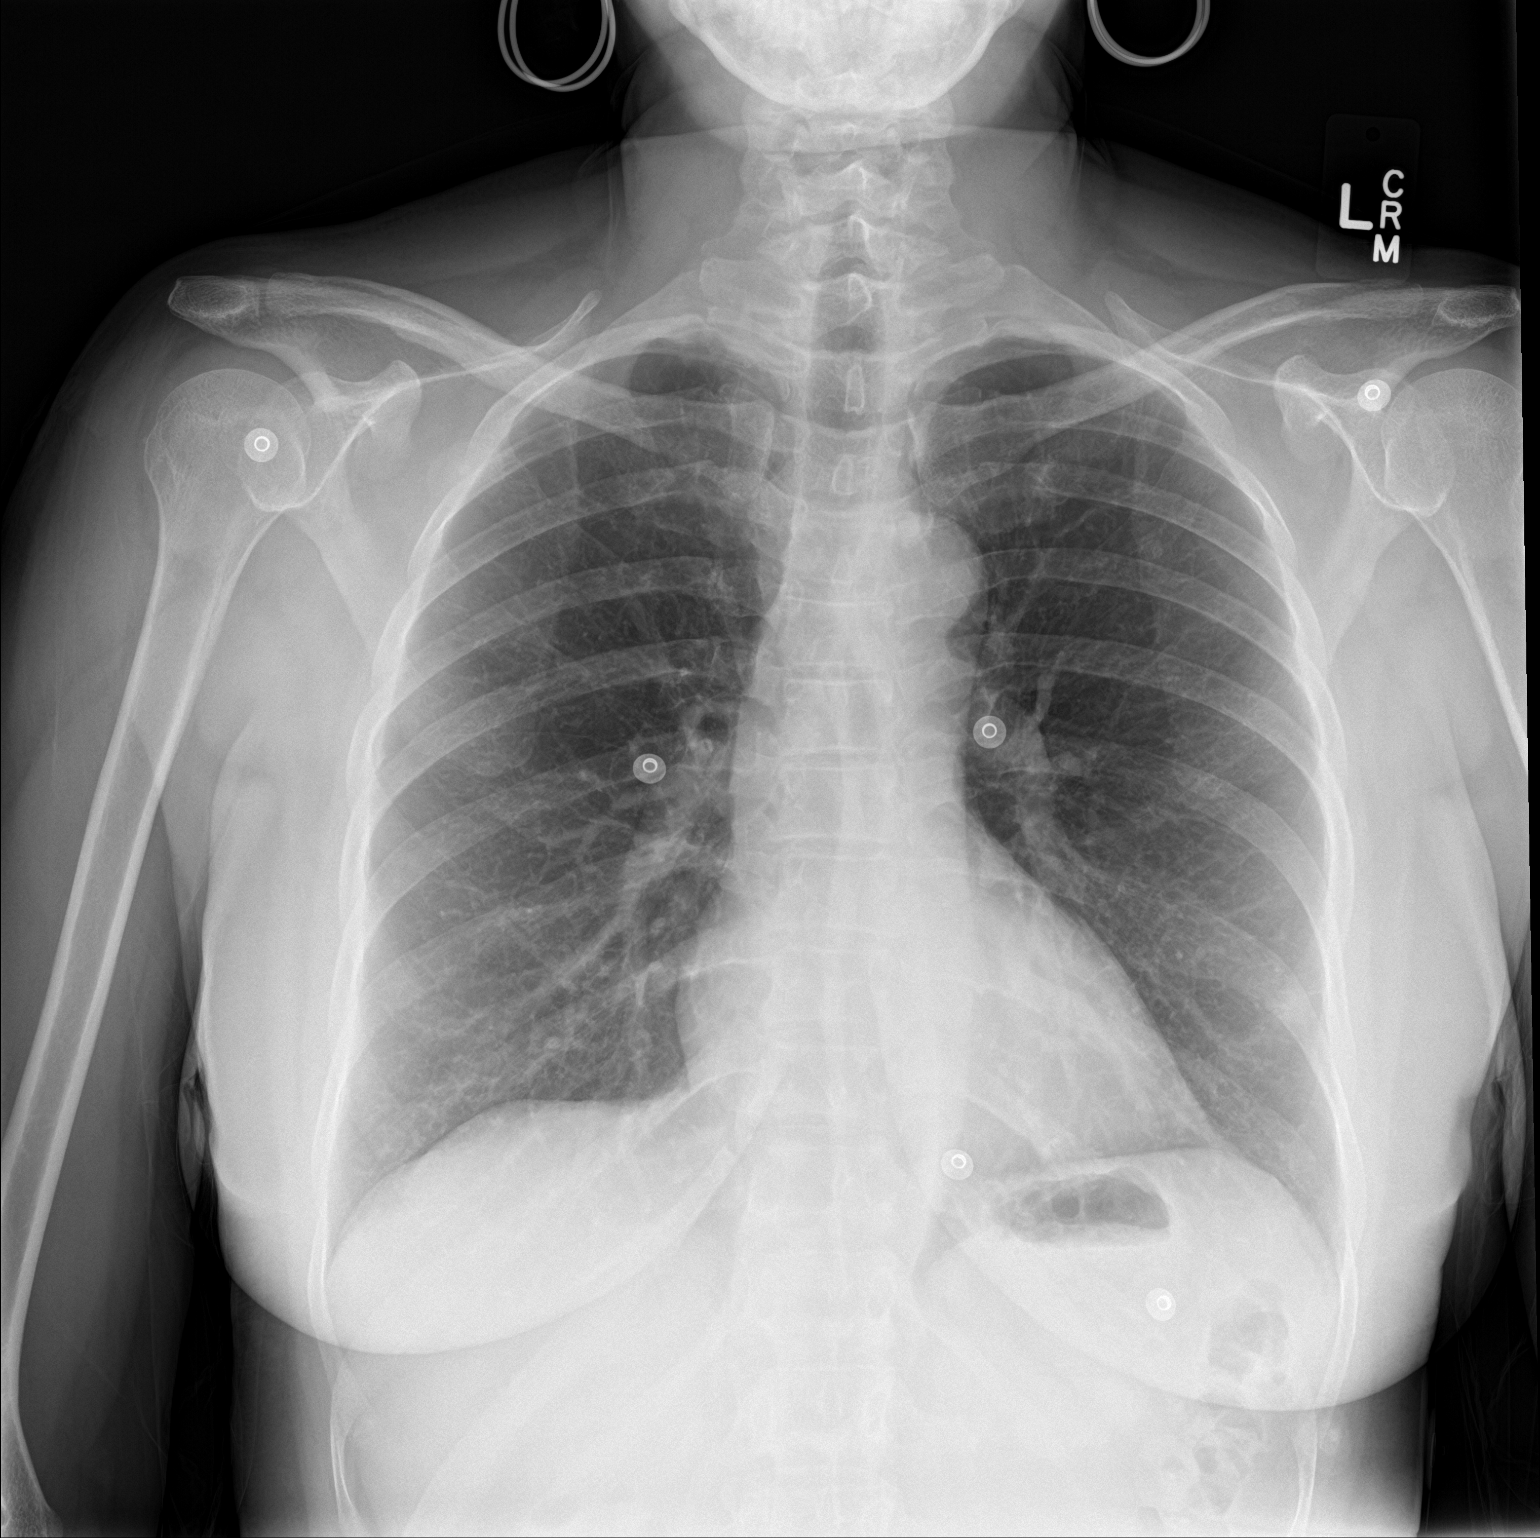

[chest lat]
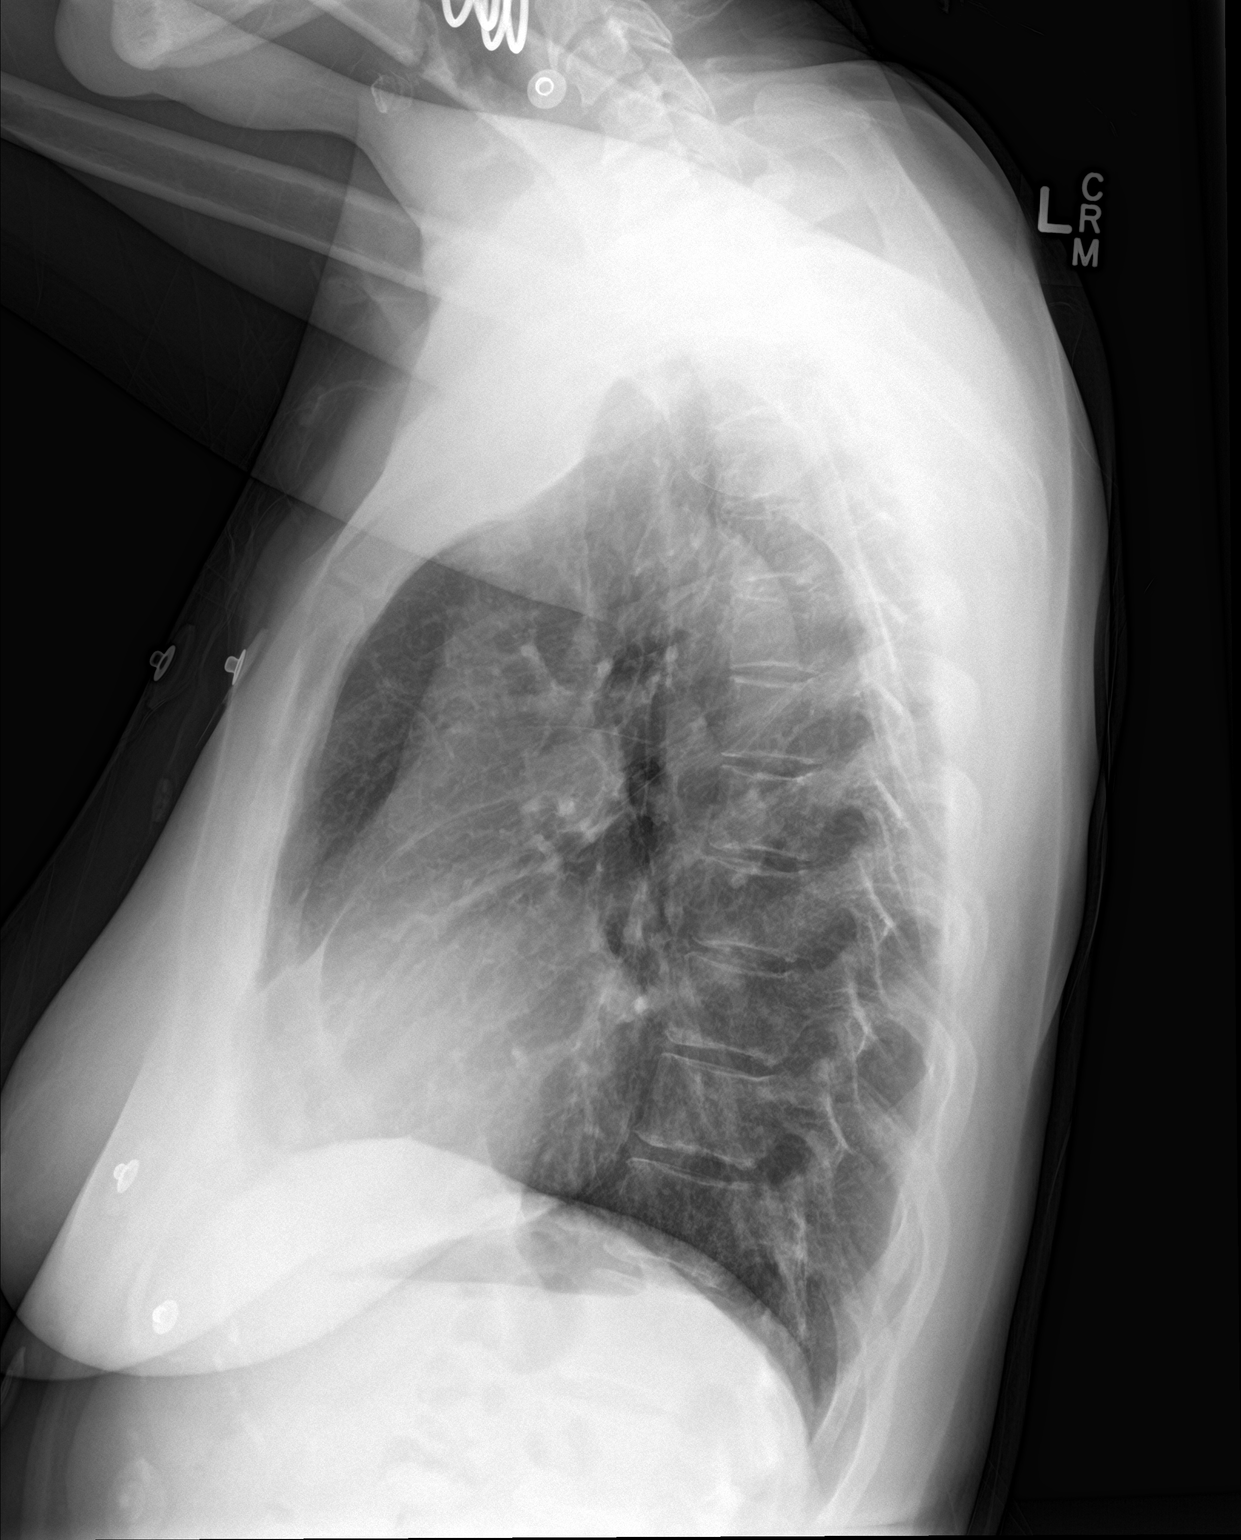

[2 of 2 positions shown; findings below may reference images not displayed]

FINDINGS: Heart and mediastinal contours are within normal limits. No focal
opacities or effusions. No acute bony abnormality.
IMPRESSION: No active cardiopulmonary disease.
